# Patient Record
Sex: Female | Born: 1965 | Race: Black or African American | Hispanic: No | Marital: Single | Smoking: Never smoker
Health system: Southern US, Community
[De-identification: ages and names within clinical notes are randomized; demographics above are authoritative.]

## PROBLEM LIST (undated history)

## (undated) DIAGNOSIS — I10 Essential (primary) hypertension: Secondary | ICD-10-CM

## (undated) DIAGNOSIS — E119 Type 2 diabetes mellitus without complications: Secondary | ICD-10-CM

## (undated) HISTORY — PX: TUBAL LIGATION: SHX77

---
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
## 2020-03-28 ENCOUNTER — Observation Stay (HOSPITAL_COMMUNITY)
Admission: EM | Admit: 2020-03-28 | Discharge: 2020-03-29 | Disposition: A | Discharge status: Home / Self Care | Payer: PRIVATE HEALTH INSURANCE | Attending: Family Medicine | Admitting: Family Medicine

## 2020-03-28 ENCOUNTER — Emergency Department (HOSPITAL_COMMUNITY): Payer: PRIVATE HEALTH INSURANCE

## 2020-03-28 ENCOUNTER — Encounter (HOSPITAL_COMMUNITY): Payer: Self-pay | Admitting: Family Medicine

## 2020-03-28 ENCOUNTER — Other Ambulatory Visit: Payer: Self-pay

## 2020-03-28 DIAGNOSIS — Z91040 Latex allergy status: Secondary | ICD-10-CM | POA: Insufficient documentation

## 2020-03-28 DIAGNOSIS — Z20822 Contact with and (suspected) exposure to covid-19: Secondary | ICD-10-CM | POA: Insufficient documentation

## 2020-03-28 DIAGNOSIS — E119 Type 2 diabetes mellitus without complications: Secondary | ICD-10-CM | POA: Insufficient documentation

## 2020-03-28 DIAGNOSIS — E782 Mixed hyperlipidemia: Secondary | ICD-10-CM | POA: Insufficient documentation

## 2020-03-28 DIAGNOSIS — R072 Precordial pain: Secondary | ICD-10-CM | POA: Diagnosis not present

## 2020-03-28 DIAGNOSIS — Z7984 Long term (current) use of oral hypoglycemic drugs: Secondary | ICD-10-CM | POA: Insufficient documentation

## 2020-03-28 DIAGNOSIS — I152 Hypertension secondary to endocrine disorders: Secondary | ICD-10-CM | POA: Diagnosis present

## 2020-03-28 DIAGNOSIS — I10 Essential (primary) hypertension: Secondary | ICD-10-CM | POA: Insufficient documentation

## 2020-03-28 DIAGNOSIS — R0789 Other chest pain: Secondary | ICD-10-CM | POA: Diagnosis present

## 2020-03-28 DIAGNOSIS — E1169 Type 2 diabetes mellitus with other specified complication: Secondary | ICD-10-CM | POA: Insufficient documentation

## 2020-03-28 DIAGNOSIS — R079 Chest pain, unspecified: Secondary | ICD-10-CM | POA: Diagnosis present

## 2020-03-28 DIAGNOSIS — Z79899 Other long term (current) drug therapy: Secondary | ICD-10-CM | POA: Diagnosis not present

## 2020-03-28 DIAGNOSIS — E1159 Type 2 diabetes mellitus with other circulatory complications: Secondary | ICD-10-CM | POA: Diagnosis present

## 2020-03-28 HISTORY — DX: Essential (primary) hypertension: I10

## 2020-03-28 HISTORY — DX: Type 2 diabetes mellitus without complications: E11.9

## 2020-03-28 HISTORY — DX: Other chest pain: R07.89

## 2020-03-28 LAB — COMPREHENSIVE METABOLIC PANEL
ALT: 16 U/L (ref 0–44)
AST: 15 U/L (ref 15–41)
Albumin: 3.6 g/dL (ref 3.5–5.0)
Alkaline Phosphatase: 67 U/L (ref 38–126)
Anion gap: 9 (ref 5–15)
BUN: 9 mg/dL (ref 6–20)
CO2: 28 mmol/L (ref 22–32)
Calcium: 9.5 mg/dL (ref 8.9–10.3)
Chloride: 99 mmol/L (ref 98–111)
Creatinine, Ser: 0.82 mg/dL (ref 0.44–1.00)
GFR, Estimated: >60 mL/min (ref >60)
Glucose, Bld: 220 mg/dL — ABNORMAL HIGH (ref 70–99)
Potassium: 4.1 mmol/L (ref 3.5–5.1)
Sodium: 136 mmol/L (ref 135–145)
Total Bilirubin: 0.4 mg/dL (ref 0.3–1.2)
Total Protein: 7.6 g/dL (ref 6.5–8.1)

## 2020-03-28 LAB — CBC WITH DIFFERENTIAL/PLATELET
Abs Immature Granulocytes: 0.02 10*3/uL (ref 0.00–0.07)
Basophils Absolute: 0 10*3/uL (ref 0.0–0.1)
Basophils Relative: 1 %
Eosinophils Absolute: 0.2 10*3/uL (ref 0.0–0.5)
Eosinophils Relative: 3 %
HCT: 39.5 % (ref 36.0–46.0)
Hemoglobin: 12.2 g/dL (ref 12.0–15.0)
Immature Granulocytes: 0 %
Lymphocytes Relative: 30 %
Lymphs Abs: 2 10*3/uL (ref 0.7–4.0)
MCH: 25.8 pg — ABNORMAL LOW (ref 26.0–34.0)
MCHC: 30.9 g/dL (ref 30.0–36.0)
MCV: 83.7 fL (ref 80.0–100.0)
Monocytes Absolute: 0.6 10*3/uL (ref 0.1–1.0)
Monocytes Relative: 9 %
Neutro Abs: 3.9 10*3/uL (ref 1.7–7.7)
Neutrophils Relative %: 57 %
Platelets: 315 10*3/uL (ref 150–400)
RBC: 4.72 MIL/uL (ref 3.87–5.11)
RDW: 14.4 % (ref 11.5–15.5)
WBC: 6.7 10*3/uL (ref 4.0–10.5)
nRBC: 0 % (ref 0.0–0.2)

## 2020-03-28 LAB — RESPIRATORY PANEL BY RT PCR (FLU A&B, COVID)
Influenza A by PCR: NEGATIVE
Influenza B by PCR: NEGATIVE
SARS Coronavirus 2 by RT PCR: NEGATIVE

## 2020-03-28 LAB — TROPONIN I (HIGH SENSITIVITY)
Troponin I (High Sensitivity): 6 ng/L (ref <18)
Troponin I (High Sensitivity): 7 ng/L (ref <18)

## 2020-03-28 LAB — D-DIMER, QUANTITATIVE: D-Dimer, Quant: 0.35 ug/mL-FEU (ref 0.00–0.50)

## 2020-03-28 LAB — HIV ANTIBODY (ROUTINE TESTING W REFLEX): HIV Screen 4th Generation wRfx: NONREACTIVE

## 2020-03-28 LAB — MRSA PCR SCREENING: MRSA by PCR: NEGATIVE

## 2020-03-28 MED ORDER — ATORVASTATIN CALCIUM 40 MG PO TABS
40.0000 mg | ORAL_TABLET | Freq: Every day | ORAL | Status: DC
  Administered 2020-03-28: 40 mg via ORAL
  Filled 2020-03-28: qty 1

## 2020-03-28 MED ORDER — HEPARIN SODIUM (PORCINE) 5000 UNIT/ML IJ SOLN
5000.0000 [IU] | Freq: Three times a day (TID) | INTRAMUSCULAR | Status: DC
  Administered 2020-03-28 – 2020-03-29 (×2): 5000 [IU] via SUBCUTANEOUS
  Filled 2020-03-28 (×3): qty 1

## 2020-03-28 MED ORDER — ACETAMINOPHEN 325 MG PO TABS: 650.0000 mg | ORAL_TABLET | ORAL | Status: DC | PRN

## 2020-03-28 MED ORDER — INSULIN ASPART 100 UNIT/ML ~~LOC~~ SOLN
0.0000 [IU] | Freq: Three times a day (TID) | SUBCUTANEOUS | Status: DC
  Administered 2020-03-29: 2 [IU] via SUBCUTANEOUS
  Administered 2020-03-29: 1 [IU] via SUBCUTANEOUS

## 2020-03-28 MED ORDER — ONDANSETRON HCL 4 MG/2ML IJ SOLN: 4.0000 mg | Freq: Four times a day (QID) | INTRAMUSCULAR | Status: DC | PRN

## 2020-03-28 MED ORDER — NITROGLYCERIN 0.4 MG SL SUBL: 0.4000 mg | SUBLINGUAL_TABLET | SUBLINGUAL | Status: DC | PRN

## 2020-03-28 NOTE — ED Provider Notes (Signed)
[facility] EMERGENCY DEPARTMENT Provider Note   CSN: [id number] Arrival date & time: 03/28/20  1155     History Chief Complaint  Patient presents with  . Chest Pain    Valerie Morgan is a 54 y.o. female.  The history is provided by the patient and medical records.  Chest Pain  Valerie Morgan is a 54 y.o. female who presents to the Emergency Department complaining of chest pain. She presents the emergency department complaining of left sided chest pain that started when she was sitting up from doing her nails. Pain is described as a pressure sensation that radiates to her back. His worse with movement. It is also worse with deep breaths. She had nitroglycerin by EMS prior to ED arrival with significant improvement in her pain. She also received 324 of aspirin prior to ED arrival. She has a history of hypertension, diabetes, hyperlipidemia. She has a family history of coronary artery disease and her father at 19. She does not smoke. She uses occasional alcohol. No drug use.    Past Medical History:  Diagnosis Date  . Diabetes mellitus (HCC)   . Hypertension   . Meningitis spinal 1983    Patient Active Problem List   Diagnosis Date Noted  . Chest pain 03/28/2020    Past Surgical History:  Procedure Laterality Date  . TUBAL LIGATION       OB History   No obstetric history on file.     Family History  Problem Relation Age of Onset  . Congestive Heart Failure Father     Social History   Tobacco Use  . Smoking status: Never Smoker  Substance Use Topics  . Alcohol use: Not Currently  . Drug use: Never    Home Medications Prior to Admission medications   Medication Sig Start Date End Date Taking? Authorizing Provider  atorvastatin (LIPITOR) 40 MG tablet Take 40 mg by mouth at bedtime.   Yes [provider]  lisinopril (ZESTRIL) 5 MG tablet Take 5 mg by mouth daily.   Yes [provider]  metFORMIN (GLUCOPHAGE-XR) 750 MG 24 hr  tablet Take 750 mg by mouth daily with breakfast.   Yes [provider]    Allergies    Latex and Penicillins  Review of Systems   Review of Systems  Cardiovascular: Positive for chest pain.  All other systems reviewed and are negative.   Physical Exam Updated Vital Signs BP 128/83 (BP Location: Right Arm)   Pulse 86   Temp 98.4 F (36.9 C) (Oral)   Resp 16   Ht 5\' 6"  (1.676 m)   Wt 104.3 kg   SpO2 97%   BMI 37.11 kg/m   Physical Exam Vitals and nursing note reviewed.  Constitutional:      Appearance: She is well-developed.  HENT:     Head: Normocephalic and atraumatic.  Cardiovascular:     Rate and Rhythm: Normal rate and regular rhythm.     Heart sounds: No murmur heard.   Pulmonary:     Effort: Pulmonary effort is normal. No respiratory distress.     Breath sounds: Normal breath sounds.  Chest:     Chest wall: No tenderness.  Abdominal:     Palpations: Abdomen is soft.     Tenderness: There is no abdominal tenderness. There is no guarding or rebound.  Musculoskeletal:        General: No swelling or tenderness.     Comments: 2+ radial and DP pulses bilaterally  Skin:    General: Skin is warm and dry.  Neurological:     Mental Status: She is alert and oriented to person, place, and time.  Psychiatric:        Behavior: Behavior normal.     ED Results / Procedures / Treatments   Labs (all labs ordered are listed, but only abnormal results are displayed) Labs Reviewed  COMPREHENSIVE METABOLIC PANEL - Abnormal; Notable for the following components:      Result Value   Glucose, Bld 220 (*)    All other components within normal limits  CBC WITH DIFFERENTIAL/PLATELET - Abnormal; Notable for the following components:   MCH 25.8 (*)    All other components within normal limits  RESPIRATORY PANEL BY RT PCR (FLU A&B, COVID)  MRSA PCR SCREENING  D-DIMER, QUANTITATIVE (NOT AT [facility])  HIV ANTIBODY (ROUTINE TESTING W REFLEX)  HEMOGLOBIN A1C  CBC    BASIC METABOLIC PANEL  LIPID PANEL  TROPONIN I (HIGH SENSITIVITY)  TROPONIN I (HIGH SENSITIVITY)    EKG EKG Interpretation  Date/Time:  Saturday March 28 2020 12:05:08 EDT Ventricular Rate:  84 PR Interval:    QRS Duration: 101 QT Interval:  357 QTC Calculation: 422 R Axis:   -36 Text Interpretation: Sinus rhythm Left axis deviation Low voltage, precordial leads Abnormal R-wave progression, early transition Borderline T abnormalities, diffuse leads No previous tracing Confirmed by Tilden Fossa [phone]) on 03/28/2020 12:06:30 PM   Radiology DG Chest 2 View  Result Date: 03/28/2020 CLINICAL DATA:  Acute LEFT chest pain and shortness of breath today. EXAM: CHEST - 2 VIEW COMPARISON:  None. FINDINGS: The cardiomediastinal silhouette is unremarkable. Equivocal lingular opacity noted. Mild peribronchial thickening is noted of uncertain chronicity. There is no evidence of pulmonary edema, suspicious pulmonary nodule/mass, pleural effusion, or pneumothorax. No acute bony abnormalities are identified. IMPRESSION: Equivocal lingular opacity which may represent atelectasis or airspace disease/pneumonia. Mild peribronchial thickening of uncertain chronicity. Electronically Signed   By: Harmon Pier M.D.   On: 03/28/2020 12:50    Procedures Procedures (including critical care time)  Medications Ordered in ED Medications  atorvastatin (LIPITOR) tablet 40 mg (has no administration in time range)  acetaminophen (TYLENOL) tablet 650 mg (has no administration in time range)  ondansetron (ZOFRAN) injection 4 mg (has no administration in time range)  heparin injection 5,000 Units (has no administration in time range)  insulin aspart (novoLOG) injection 0-9 Units (has no administration in time range)    ED Course  I have reviewed the triage vital signs and the nursing notes.  Pertinent labs & imaging results that were available during my care of the patient were reviewed by me and considered  in my medical decision making (see chart for details).    MDM Rules/Calculators/A&P                         patient with history of diabetes, hypertension, hyperlipidemia here for evaluation of chest pain that started just prior to ED arrival. EKG with nonspecific changes and no priors available for comparison. Doubt PE, D dimer is negative. Chest x-ray with atelectasis, doubt pneumonia, personally reviewed. Patient's pain did improve with nitroglycerin and on repeat assessment is resolved. Given her hear score of five recommend observation for chest pain rule out. Discussed with patient findings of studies and she is in agreement with plan. Medicine consulted for admission.  Final Clinical Impression(s) / ED Diagnoses Final diagnoses:  Precordial pain    Rx /  DC Orders ED Discharge Orders    None       Tilden Fossa, MD 03/28/20 2152

## 2020-03-28 NOTE — ED Triage Notes (Signed)
Pt arrives via EMS from home with complaints of sudden onset of chest pressure radiating to back. Movement would increase pain. 7/10 Pt hypertensive at 190/100 with EMS. Did not take BP medicine today  EMS gave 1X 0.4 Nitro with moderate relief of pain 5/10 324 ASA

## 2020-03-28 NOTE — H&P (Signed)
Family Medicine Teaching [facility] Admission History and Physical Service Pager: [phone]  Patient name: Valerie Morgan Medical record number: [id number] Date of birth: [birth year] Age: 54 y.o. Gender: female  Primary Care Provider: Patient, No Pcp Per Consultants: none Code Status: full  Chief Complaint: chest pain  Assessment and Plan: Valerie Morgan is a 54 y.o. female presenting with 1 day hx of chest pain . PMH is significant for HTN, DM  Chest pain  One day hx of chest pain in the left side with similar pain on the left posterior chest..  Worse with positional changes and palpation.  Pain improved but still present from this morning.  Negative trops.  T waves flat.  D-dimer negative.  Normal CXR.  Given the association with positions and movement as well as the timing coinciding with stretching forward, this is likely to be musculoskeletal pain/chest wall pain.  Possibly GI related pain from reflux.  Doubtful this is ACS.  Will admit for overnight observation, get echo and morning EKG. -Admit to obs, Dr. Celso Amy attending -Follow-up echo -Morning EKG -Continuous cardiac monitoring -Vitals per routine -A.m. CBC and BMP -A.m. lipid panel  Diabetes mellitus Prior history of diabetes for which she takes Metformin. -Sensitive sliding scale insulin with CBGs -A.m. A1c  HTN Patient takes 5 mg of lisinopril she states.  Normotensive today -Hold home medication   FEN/GI: Heart healthy diet Prophylaxis: Lovenox  Disposition: Home  History of Present Illness:  Valerie Morgan is a 54 y.o. female presenting with 1 day history of chest pain  Chest pain started at 10am this morning.  She was 'pampering her feet' on the couch and developed pain while reaching for something.  She walked around and massaged it to improve the pain but then it started getting 'tense' so she called EMS. The EMS gave her aspirin and nitro and this helped her pain.   Pain is still there but decreased  to about a 4/10 and before was a 7/10. No nuasea or sweating.  She had SOB with pain that occurred with certain positions, mostly the left side.  Pain radiated towards her back, on the left side.  No hx of heart disease.  Father had  hx of MI, and CHF.    Has possible acid reflux that is mild but does not take medications for it, just avoids certain foods..   No fever/diarrhea/abdominal pain.     Just moved from [place] in June.  Lives alone.   No family down here, but does have a few friends.  Never smoked cigarettes.  Drinks rarely.  No drug use.    Takes Metformin 750 qd XR, 40mg  of atorvastatin, and 5mg  lisinopril. Compliant with all medications.   No other medical issues.  Had a history of spinal meningitis in 1983  Surgical history includes tubal ligation.    Allergic to penicillin.     Review Of Systems: Per HPI with the following additions:   Review of Systems  Constitutional: Negative for chills, fever and weight loss.  HENT: Negative for hearing loss and sore throat.   Eyes: Negative for blurred vision and double vision.  Respiratory: Positive for shortness of breath. Negative for cough.   Cardiovascular: Positive for chest pain. Negative for palpitations, orthopnea and leg swelling.  Gastrointestinal: Negative for abdominal pain, diarrhea, nausea and vomiting.  Genitourinary: Negative for dysuria.  Musculoskeletal: Negative for joint pain and myalgias.  Skin: Negative for rash.  Neurological: Positive for headaches. Negative for dizziness.  Endo/Heme/Allergies: Bruises/bleeds  easily.  Psychiatric/Behavioral: Negative for substance abuse.    Patient Active Problem List   Diagnosis Date Noted  . Chest pain 03/28/2020    Past Medical History: Past Medical History:  Diagnosis Date  . Diabetes mellitus (HCC)   . Hypertension   . Meningitis spinal 1983    Past Surgical History: Past Surgical History:  Procedure Laterality Date  . TUBAL LIGATION      Social  History: Social History   Tobacco Use  . Smoking status: Never Smoker  Substance Use Topics  . Alcohol use: Not Currently  . Drug use: Never   Additional social history:   Please also refer to relevant sections of EMR.  Family History: Family History  Problem Relation Age of Onset  . Congestive Heart Failure Father     Allergies and Medications: Allergies  Allergen Reactions  . Latex Rash  . Penicillins Rash    Reaction: 25 years ago   No current facility-administered medications on file prior to encounter.   Current Outpatient Medications on File Prior to Encounter  Medication Sig Dispense Refill  . atorvastatin (LIPITOR) 40 MG tablet Take 40 mg by mouth at bedtime.    Marland Kitchen lisinopril (ZESTRIL) 5 MG tablet Take 5 mg by mouth daily.    . metFORMIN (GLUCOPHAGE-XR) 750 MG 24 hr tablet Take 750 mg by mouth daily with breakfast.      Objective: BP 128/83 (BP Location: Right Arm)   Pulse 86   Temp 98.4 F (36.9 C) (Oral)   Resp 16   Ht 5\' 6"  (1.676 m)   Wt 104.3 kg   SpO2 97%   BMI 37.11 kg/m  Exam: General: Alert, oriented.  No acute distress.  Laying in bed Eyes: PERRLA.  EOMI. ENTM: Moist oral mucosa.  No oral pharyngeal erythema Neck: No thyromegaly Cardiovascular: Regular rate and rhythm, no no murmurs Respiratory: Lungs clear to auscultation bilaterally, no wheezes or crackles Gastrointestinal: Soft, nontender.  Normal bowel sounds MSK: 5/5 strength in the upper and lower extremities bilaterally.  Tender to palpation on the left chest wall and left sided mid thoracic back.  Pain in the chest with crossing of left arm over chest. Derm: No rashes.  Warm and dry Neuro: Cranial nerves II through XII grossly intact.  Sensation intact. Psych: Normal affect.  Labs and Imaging: CBC BMET  Recent Labs  Lab 03/28/20 1205  WBC 6.7  HGB 12.2  HCT 39.5  PLT 315   Recent Labs  Lab 03/28/20 1205  NA 136  K 4.1  CL 99  CO2 28  BUN 9  CREATININE 0.82  GLUCOSE  220*  CALCIUM 9.5       03/30/20, MD 03/28/2020, 8:11 PM PGY-3, [facility] Family Medicine FPTS Intern pager: [phone], text pages welcome

## 2020-03-29 ENCOUNTER — Encounter (HOSPITAL_COMMUNITY): Payer: Self-pay | Admitting: Family Medicine

## 2020-03-29 ENCOUNTER — Observation Stay (HOSPITAL_BASED_OUTPATIENT_CLINIC_OR_DEPARTMENT_OTHER): Payer: PRIVATE HEALTH INSURANCE

## 2020-03-29 DIAGNOSIS — E119 Type 2 diabetes mellitus without complications: Secondary | ICD-10-CM

## 2020-03-29 DIAGNOSIS — R0789 Other chest pain: Secondary | ICD-10-CM | POA: Diagnosis not present

## 2020-03-29 DIAGNOSIS — E782 Mixed hyperlipidemia: Secondary | ICD-10-CM

## 2020-03-29 DIAGNOSIS — R072 Precordial pain: Secondary | ICD-10-CM | POA: Diagnosis not present

## 2020-03-29 DIAGNOSIS — R079 Chest pain, unspecified: Secondary | ICD-10-CM

## 2020-03-29 DIAGNOSIS — E1159 Type 2 diabetes mellitus with other circulatory complications: Secondary | ICD-10-CM | POA: Diagnosis not present

## 2020-03-29 DIAGNOSIS — I152 Hypertension secondary to endocrine disorders: Secondary | ICD-10-CM

## 2020-03-29 DIAGNOSIS — E1169 Type 2 diabetes mellitus with other specified complication: Secondary | ICD-10-CM

## 2020-03-29 HISTORY — DX: Type 2 diabetes mellitus without complications: E11.9

## 2020-03-29 HISTORY — DX: Type 2 diabetes mellitus with other specified complication: E11.69

## 2020-03-29 HISTORY — DX: Hypertension secondary to endocrine disorders: I15.2

## 2020-03-29 HISTORY — DX: Type 2 diabetes mellitus with other circulatory complications: E11.59

## 2020-03-29 LAB — LIPID PANEL
Cholesterol: 307 mg/dL — ABNORMAL HIGH (ref 0–200)
HDL: 59 mg/dL (ref >40)
LDL Cholesterol: 200 mg/dL — ABNORMAL HIGH (ref 0–99)
Total CHOL/HDL Ratio: 5.2 RATIO
Triglycerides: 242 mg/dL — ABNORMAL HIGH (ref <150)
VLDL: 48 mg/dL — ABNORMAL HIGH (ref 0–40)

## 2020-03-29 LAB — CBC
HCT: 38.4 % (ref 36.0–46.0)
Hemoglobin: 12.2 g/dL (ref 12.0–15.0)
MCH: 26.5 pg (ref 26.0–34.0)
MCHC: 31.8 g/dL (ref 30.0–36.0)
MCV: 83.3 fL (ref 80.0–100.0)
Platelets: 312 10*3/uL (ref 150–400)
RBC: 4.61 MIL/uL (ref 3.87–5.11)
RDW: 14.6 % (ref 11.5–15.5)
WBC: 7.7 10*3/uL (ref 4.0–10.5)
nRBC: 0 % (ref 0.0–0.2)

## 2020-03-29 LAB — BASIC METABOLIC PANEL
Anion gap: 11 (ref 5–15)
BUN: 9 mg/dL (ref 6–20)
CO2: 25 mmol/L (ref 22–32)
Calcium: 9.2 mg/dL (ref 8.9–10.3)
Chloride: 102 mmol/L (ref 98–111)
Creatinine, Ser: 0.69 mg/dL (ref 0.44–1.00)
GFR, Estimated: >60 mL/min (ref >60)
Glucose, Bld: 149 mg/dL — ABNORMAL HIGH (ref 70–99)
Potassium: 3.9 mmol/L (ref 3.5–5.1)
Sodium: 138 mmol/L (ref 135–145)

## 2020-03-29 LAB — GLUCOSE, CAPILLARY
Glucose-Capillary: 169 mg/dL — ABNORMAL HIGH (ref 70–99)
Glucose-Capillary: 132 mg/dL — ABNORMAL HIGH (ref 70–99)

## 2020-03-29 LAB — ECHOCARDIOGRAM COMPLETE
Area-P 1/2: 4.68 cm2
Height: 66 in
S' Lateral: 2.9 cm
Weight: 3678.4 oz

## 2020-03-29 MED ORDER — FAMOTIDINE 20 MG PO TABS: 20.0000 mg | ORAL_TABLET | Freq: Two times a day (BID) | ORAL | 0 refills | Status: DC

## 2020-03-29 NOTE — Hospital Course (Addendum)
Valerie Morgan is a 54 y.o. female presenting with 1 day hx of chest pain . PMH is significant for HTN and DM.  Chest pain Presented with chest pain that radiated down her left arm after bending down to clip her toe nails. Pain significantly improved after given aspirin and nitroglycerin in the ED. Troponins 6>7. D-dimer negative. CXR unremarkable. EKG demonstrated no significant ST elevations with normal sinus rhythm compared to initial EKG noted to have flat T waves. Patient placed on continued cardiac monitoring. Echo demonstrated *** Given patient's pain due to positional changes and reproducible on exam along with GERD component as she reports eating 30 minutes prior to this episode, likely MSK etiology with a GI component. Very low concern for ACS. Pain improved and patient discharged.   Hyperlipidemia Lipid panel demonstrated elevated cholesterol 307, LDL 200 and triglycerides 657. Started on atorvastatin 40 mg daily. Follow up with PCP for repeat lipid panel along with diet and exercise counseling.   All other issues chronic and stable.   F/u: establish care with PCP, f/u lipid panel and Ac1 in 3 months. Diet and exercise counseling, f/u cards outpatient if chest pain returns

## 2020-03-29 NOTE — Discharge Instructions (Signed)
You were hospitalized at [facility] due to chest pain.  We expect this is from a combination of muscle strain and reflux which improved after aspirin and nitroglycerin medications in the ED. We are so glad you are feeling better.  Please also be sure to follow-up with our clinic/PCP, your next appointment is 04/09/2020 at 3:15 pm.  Thank you for allowing [place] to take care of you.  - Stop by your pharmacy to pick up your new prescriptions   Take care, Dr. Robyne Peers

## 2020-03-29 NOTE — Progress Notes (Signed)
  Echocardiogram 2D Echocardiogram has been performed.  Delcie Roch 03/29/2020, 11:21 AM

## 2020-03-29 NOTE — Progress Notes (Addendum)
Pt got discharged to home, discharge instructions provided and patient showed understanding to it, IV taken out,Telemonitor DC,pt left unit in wheelchair with all of the belongings.Taxi cab is called for the patient with the help of social worker, Conservation officer, nature  provided to the patient  Lonia Farber, Charity fundraiser

## 2020-03-29 NOTE — Discharge Summary (Addendum)
Family Medicine Teaching [facility] Discharge Summary  Patient name: Valerie Morgan Medical record number: [id number] Date of birth: [birth year] Age: 54 y.o. Gender: female Date of Admission: 03/28/2020  Date of Discharge: 03/29/2020 Admitting Physician: Sandre Kitty, MD  Primary Care Provider: Patient, No Pcp Per Consultants: none  Indication for Hospitalization: chest pain   Discharge Diagnoses/Problem List:  Chest pain HLD HTN DM  Disposition: home  Discharge Condition: medically stable   Discharge Exam:   Physical Exam: General: Patient sitting upright in bed comfortably, in no acute distress. Cardiovascular: RRR, no murmurs or gallops noted  Respiratory: lungs clear to auscultation bilaterally, breathing comfortably on room air  Abdomen: soft, nontender, presence of active bowel sounds  Extremities: radial and dorsalis pedis pulses strong and equal bilaterally, no LE edema noted   Brief Hospital Course:  Valerie Morgan is a 54 y.o. female presenting with 1 day hx of chest pain . PMH is significant for HTN and DM.  Chest pain Presented with chest pain that radiated down her left arm after bending down to clip her toe nails. Pain significantly improved after given aspirin and nitroglycerin in the ED. Troponins 6>7. D-dimer negative. CXR unremarkable. EKG demonstrated no significant ST elevations with normal sinus rhythm compared to initial EKG noted to have flat T waves. Patient placed on continued cardiac monitoring. Echo demonstrated EF 60-65% without any regional wall motion abnormalities. Given patient's pain due to positional changes and reproducible on exam along with GERD component as she reports eating 30 minutes prior to this episode, likely MSK etiology with a GI component. Very low concern for ACS. Pain improved and patient discharged.   Hyperlipidemia Lipid panel demonstrated elevated cholesterol 307, LDL 200 and triglycerides 528. Started on atorvastatin 40  mg daily. Follow up with PCP for repeat lipid panel along with diet and exercise counseling.  HTN Patient home med includes lisinopril 5 mg. Multiple hypotensive blood pressures during hospital stay. Lisinopril held at discharge, instructed to follow up with PCP regarding potentially restarting medication. Diet and exercise discussed with patient.   All other issues chronic and stable.     Issues for Follow Up:  1. Follow up with PCP for lipid panel and A1c. Diet and exercise counseling. 2. Follow up with PCP regarding lisinopril medication held due to low blood pressures during hospitalization.  3. Consider outpatient cardiology follow up if chest pain returns.  Significant Procedures:  none  Significant Labs and Imaging:  Recent Labs  Lab 03/28/20 1205 03/29/20 0059  WBC 6.7 7.7  HGB 12.2 12.2  HCT 39.5 38.4  PLT 315 312   Recent Labs  Lab 03/28/20 1205 03/29/20 0059  NA 136 138  K 4.1 3.9  CL 99 102  CO2 28 25  GLUCOSE 220* 149*  BUN 9 9  CREATININE 0.82 0.69  CALCIUM 9.5 9.2  ALKPHOS 67  --   AST 15  --   ALT 16  --   ALBUMIN 3.6  --       Results/Tests Pending at Time of Discharge:  ECHOCARDIOGRAM COMPLETE  Result Date: 03/29/2020    ECHOCARDIOGRAM REPORT   Patient Name:   Valerie Morgan Date of Exam: 03/29/2020 Medical Rec #:  [id number]      Height:       66.0 in Accession #:    [id number]     Weight:       229.9 lb Date of Birth:  1965/11/04      BSA:  2.122 m Patient Age:    54 years       BP:           116/58 mmHg Patient Gender: F              HR:           75 bpm. Exam Location:  Inpatient Procedure: 2D Echo Indications:    chest pain 786.50  History:        Patient has no prior history of Echocardiogram examinations.                 Signs/Symptoms:Chest Pain; Risk Factors:Diabetes, Dyslipidemia                 and Hypertension.  Sonographer:    Delcie Roch Referring Phys: [id number] TODD D MCDIARMID IMPRESSIONS  1. Left ventricular ejection  fraction, by estimation, is 60 to 65%. The left ventricle has normal function. The left ventricle has no regional wall motion abnormalities. Left ventricular diastolic parameters were normal.  2. Right ventricular systolic function is normal. The right ventricular size is normal. There is normal pulmonary artery systolic pressure.  3. The mitral valve is normal in structure. Trivial mitral valve regurgitation. No evidence of mitral stenosis.  4. The aortic valve is normal in structure. Aortic valve regurgitation is not visualized. No aortic stenosis is present.  5. The inferior vena cava is normal in size with greater than 50% respiratory variability, suggesting right atrial pressure of 3 mmHg. Comparison(s): No prior Echocardiogram. Conclusion(s)/Recommendation(s): Normal biventricular function without evidence of hemodynamically significant valvular heart disease. FINDINGS  Left Ventricle: Left ventricular ejection fraction, by estimation, is 60 to 65%. The left ventricle has normal function. The left ventricle has no regional wall motion abnormalities. The left ventricular internal cavity size was normal in size. There is  no left ventricular hypertrophy. Left ventricular diastolic parameters were normal. Right Ventricle: The right ventricular size is normal. No increase in right ventricular wall thickness. Right ventricular systolic function is normal. There is normal pulmonary artery systolic pressure. The tricuspid regurgitant velocity is 2.33 m/s, and  with an assumed right atrial pressure of 3 mmHg, the estimated right ventricular systolic pressure is 24.7 mmHg. Left Atrium: Left atrial size was normal in size. Right Atrium: Right atrial size was normal in size. Pericardium: There is no evidence of pericardial effusion. Mitral Valve: The mitral valve is normal in structure. Trivial mitral valve regurgitation. No evidence of mitral valve stenosis. Tricuspid Valve: The tricuspid valve is normal in structure.  Tricuspid valve regurgitation is trivial. Aortic Valve: The aortic valve is normal in structure. Aortic valve regurgitation is not visualized. No aortic stenosis is present. Pulmonic Valve: The pulmonic valve was not well visualized. Pulmonic valve regurgitation is not visualized. Aorta: The aortic root, ascending aorta, aortic arch and descending aorta are all structurally normal, with no evidence of dilitation or obstruction. Venous: The inferior vena cava is normal in size with greater than 50% respiratory variability, suggesting right atrial pressure of 3 mmHg. IAS/Shunts: No atrial level shunt detected by color flow Doppler.  LEFT VENTRICLE PLAX 2D LVIDd:         4.40 cm  Diastology LVIDs:         2.90 cm  LV e' medial:    8.92 cm/s LV PW:         0.90 cm  LV E/e' medial:  8.6 LV IVS:        0.90 cm  LV e' lateral:  9.79 cm/s LVOT diam:     2.00 cm  LV E/e' lateral: 7.8 LV SV:         58 LV SV Index:   27 LVOT Area:     3.14 cm  RIGHT VENTRICLE             IVC RV S prime:     11.90 cm/s  IVC diam: 1.40 cm TAPSE (M-mode): 2.4 cm LEFT ATRIUM             Index       RIGHT ATRIUM           Index LA diam:        3.20 cm 1.51 cm/m  RA Area:     12.20 cm LA Vol (A2C):   49.8 ml 23.47 ml/m RA Volume:   26.70 ml  12.58 ml/m LA Vol (A4C):   43.6 ml 20.55 ml/m LA Biplane Vol: 47.1 ml 22.20 ml/m  AORTIC VALVE LVOT Vmax:   93.30 cm/s LVOT Vmean:  64.200 cm/s LVOT VTI:    0.185 m  AORTA Ao Root diam: 2.70 cm Ao Asc diam:  2.40 cm MITRAL VALVE               TRICUSPID VALVE MV Area (PHT): 4.68 cm    TR Peak grad:   21.7 mmHg MV Decel Time: 162 msec    TR Vmax:        233.00 cm/s MV E velocity: 76.30 cm/s MV A velocity: 81.80 cm/s  SHUNTS MV E/A ratio:  0.93        Systemic VTI:  0.18 m                            Systemic Diam: 2.00 cm Jodelle Red MD Electronically signed by Jodelle Red MD Signature Date/Time: 03/29/2020/12:30:02 PM    Final     Discharge Medications:  Allergies as of 03/29/2020       Reactions   Latex Rash   Penicillins Rash   Reaction: 25 years ago      Medication List    STOP taking these medications   lisinopril 5 MG tablet Commonly known as: ZESTRIL     TAKE these medications   atorvastatin 40 MG tablet Commonly known as: LIPITOR Take 40 mg by mouth at bedtime.   famotidine 20 MG tablet Commonly known as: PEPCID Take 1 tablet (20 mg total) by mouth 2 (two) times daily.   metFORMIN 750 MG 24 hr tablet Commonly known as: GLUCOPHAGE-XR Take 750 mg by mouth daily with breakfast.       Discharge Instructions: Please refer to Patient Instructions section of EMR for full details.  Patient was counseled important signs and symptoms that should prompt return to medical care, changes in medications, dietary instructions, activity restrictions, and follow up appointments.   Follow-Up Appointments:  Follow-up Information    Ganta, Anupa, DO. Go on 04/09/2020.   Specialty: Family Medicine Why: Please arrive to your scheduled appointment at 3:15 pm.  Contact information: [address] [place] [address] [phone]               Reece Leader, DO 03/29/2020, 1:29 PM PGY-1, [facility]  Katha Cabal, DO PGY-2, [facility] Family Medicine 03/30/2020 2:37 PM

## 2020-03-29 NOTE — Plan of Care (Signed)

## 2020-03-29 NOTE — Progress Notes (Signed)
Family Medicine Teaching Service Daily Progress Note Intern Pager: [phone]  Patient name: Valerie Morgan Medical record number: [id number] Date of birth: [birth year] Age: 54 y.o. Gender: female  Primary Care Provider: Patient, No Pcp Per Consultants: none Code Status: Full   Pt Overview and Major Events to Date:  Admitted 10/23  Assessment and Plan: Valerie Morgan is a 54 y.o. female presenting with 1 day hx of chest pain . PMH is significant for HTN and DM.   Chest pain  Admitted for chest pain radiating to her left side. Improved with aspirin and nitroglycerin in the ED. Negative troponins and negative D-dimer.  Normal CXR. Today rates 2/10 pain, significantly improved compared to 6/10 on admission. EKG demonstrated no significant ST elevations with normal sinus rhythm compared to initial EKG noted to have flat T waves. Low concern for ACS at this time given prior reproducible pain on admission. Most likely MSK etiology given positional changes causing pain with  GERD component since patient reports that she ate about 30 minutes prior to this.   -echo complete, pending results -tylenol 650 mg prn for pain  -Continuous cardiac monitoring -awaiting CBC and BMP  Hyperlipidemia Lipid panel demonstrated elevated cholesterol 307, LDL 200 and triglycerides 329. -atorvastatin 40 mg daily -establish care with PCP and f/u outpatient   Diabetes mellitus Prior history of diabetes for which she takes Metformin. -hold home metformin -pending A1c -sSSI -monitor CBGs -f/u with PCP outpatient   HTN Normotensive this morning at 120/60. Home med includes lisinopril 5 mg.   -Hold home medication  FEN/GI: heart healthy diet  PPx: heparin    Disposition: inpatient, home following workup      Subjective:  Patient endorses significantly improved chest pain and denies dyspnea. Denies dizziness, headache and weakness. Reports that she has been up to use the restroom multiple times without  issues.  Objective: Temp:  [97.8 F (36.6 C)-98.4 F (36.9 C)] 98 F (36.7 C) (10/24 0449) Pulse Rate:  [74-92] 74 (10/24 0000) Resp:  [13-19] 16 (10/24 0000) BP: (108-145)/(45-83) 120/60 (10/24 0449) SpO2:  [91 %-100 %] 94 % (10/24 0000) Weight:  [101.6 kg-104.3 kg] 104.3 kg (10/23 1853) Physical Exam: General: Patient sitting upright in bed comfortably, in no acute distress. Cardiovascular: RRR, no murmurs or gallops noted  Respiratory: lungs clear to auscultation bilaterally, breathing comfortably on room air  Abdomen: soft, nontender, presence of active bowel sounds  Extremities: radial and dorsalis pedis pulses strong and equal bilaterally, no LE edema noted   Laboratory: Recent Labs  Lab 03/28/20 1205 03/29/20 0059  WBC 6.7 7.7  HGB 12.2 12.2  HCT 39.5 38.4  PLT 315 312   Recent Labs  Lab 03/28/20 1205 03/29/20 0059  NA 136 138  K 4.1 3.9  CL 99 102  CO2 28 25  BUN 9 9  CREATININE 0.82 0.69  CALCIUM 9.5 9.2  PROT 7.6  --   BILITOT 0.4  --   ALKPHOS 67  --   ALT 16  --   AST 15  --   GLUCOSE 220* 149*      Imaging/Diagnostic Tests: DG Chest 2 View  Result Date: 03/28/2020 CLINICAL DATA:  Acute LEFT chest pain and shortness of breath today. EXAM: CHEST - 2 VIEW COMPARISON:  None. FINDINGS: The cardiomediastinal silhouette is unremarkable. Equivocal lingular opacity noted. Mild peribronchial thickening is noted of uncertain chronicity. There is no evidence of pulmonary edema, suspicious pulmonary nodule/mass, pleural effusion, or pneumothorax. No acute bony abnormalities are identified.  IMPRESSION: Equivocal lingular opacity which may represent atelectasis or airspace disease/pneumonia. Mild peribronchial thickening of uncertain chronicity. Electronically Signed   By: Harmon Pier M.D.   On: 03/28/2020 12:50    Reece Leader, DO 03/29/2020, 7:15 AM PGY-1, [place] [facility] FPTS Intern pager: [phone], text pages welcome

## 2020-03-29 NOTE — Plan of Care (Signed)

## 2020-03-30 LAB — HEMOGLOBIN A1C
Hgb A1c MFr Bld: 8 % — ABNORMAL HIGH (ref 4.8–5.6)
Mean Plasma Glucose: 183 mg/dL

## 2020-04-09 ENCOUNTER — Ambulatory Visit (INDEPENDENT_AMBULATORY_CARE_PROVIDER_SITE_OTHER): Payer: PRIVATE HEALTH INSURANCE | Admitting: Family Medicine

## 2020-04-09 ENCOUNTER — Other Ambulatory Visit: Payer: Self-pay

## 2020-04-09 VITALS — BP 120/76 | HR 74 | Ht 66.0 in | Wt 232.5 lb

## 2020-04-09 DIAGNOSIS — Z23 Encounter for immunization: Secondary | ICD-10-CM

## 2020-04-09 DIAGNOSIS — E1159 Type 2 diabetes mellitus with other circulatory complications: Secondary | ICD-10-CM | POA: Diagnosis not present

## 2020-04-09 DIAGNOSIS — I152 Hypertension secondary to endocrine disorders: Secondary | ICD-10-CM

## 2020-04-09 DIAGNOSIS — Z0000 Encounter for general adult medical examination without abnormal findings: Secondary | ICD-10-CM | POA: Diagnosis not present

## 2020-04-09 DIAGNOSIS — K219 Gastro-esophageal reflux disease without esophagitis: Secondary | ICD-10-CM | POA: Diagnosis not present

## 2020-04-09 DIAGNOSIS — E782 Mixed hyperlipidemia: Secondary | ICD-10-CM

## 2020-04-09 NOTE — Progress Notes (Signed)
SUBJECTIVE:   CHIEF COMPLAINT / HPI:   Hospital follow up and establish care Patient presents for hospital follow up after being recently admitted for chest pain and dyspnea. 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 States that she recently ate before this and was experiencing reflux symptoms and epigastric pain. Cause of chest pain determined to be non-cardiac and possibly multifactorial from both MSK-related and GERD. Patient also here to establish care, she has been looking for a PCP since she moved from [place]. Denies chest pain and dyspnea today. Never had a colonoscopy or mammogram.   PERTINENT  PMH / PSH:   GERD Denies epigastric pain but states that her reflux symptoms have been worsening over the past week. Compliant on famotidine 20 mg bid. Tolerating medication well without complications. Endorses sometimes eating a late dinner and spicy food. Endorses globulus sensation but states that her medication really helps better control her symptoms.  HTN Prior to patient's hospitalization, she was taking lisinopril 5 mg daily, medication was not continued at discharge given normotensive pressures and history of dizziness along with the onset of her chest pain that initially got her admitted into the hospital. Denies chest pain and dyspnea. States that since she has stopped the lisinopril she has not been feeling dizzy, able to sleep better and not having headaches as often as she did while on the medication.  Type 2 DM A1c 8 on 10/24 (during last hospitalization). Compliant on home med metformin 750 mg daily, tolerating well without complications. Denies hypoglycemic episodes. Checks her glucose levels daily, they typically range between 110-120. Denies dizziness and vision changes. Has not seen an ophthalmologist in 2 years.   HLD Taking atorvastatin 40 mg daily. Most recent lipid panel notable for cholesterol 307, triglycerides 242 and LDL  200. Reports starting to exercise more often and aims to continue to do this more regularly and more often during the week.    OBJECTIVE:   BP 120/76   Pulse 74   Ht 5\' 6"  (1.676 m)   Wt 232 lb 8 oz (105.5 kg)   LMP 12/04/2017   SpO2 99%   BMI 37.53 kg/m   General: Patient well-appearing, in no acute distress. HEENT: normocephalic, atraumatic, supple neck without evidence of lymphadenopathy CV: RRR, no murmurs or gallops auscultated Resp: lungs clear to auscultation bilaterally, no rales or rhonchi noted Abdomen:soft, nontender, presence of active bowel sounds Derm: skin warm and dry to touch, no rashes or lesions noted Ext: radial and distal pulses strong and equal bilaterally, no LE edema noted bilaterally Neuro: ambulates without limitation or assistance, normal gait  Psych: mood appropriate, very pleasant    ASSESSMENT/PLAN:   GERD (gastroesophageal reflux disease) -continue famotidine 20 mg bid -instructed on GERD precautions: avoiding spicy foods and trigger foods, not eating right before bed, etc.   Hypertension associated with diabetes (HCC) -normotensive BP in the office of 120/76, within goal of 130/80 -did not restart lisinopril to prevent hypotension and dizziness associated with it -reassess and add antihypertensive agent as needed -continue metformin 750 mg -recheck A1c at next visit  -extensive diet and exercise counseling provided  -instructed to schedule ophthalmology appointment very soon, patient understands the high importance and agrees  HLD (hyperlipidemia) -continue atorvastatin 40 mg daily -lifestyle modifications encouraged and emphasized    Health maintenance  -received pneumococcal vaccine today -GI referral placed for colonoscopy  -ordered mammogram -received COVID vaccine prior to visit, will bring  records at next appointment     Reece Leader, DO [facility]

## 2020-04-09 NOTE — Patient Instructions (Addendum)
It was so great seeing you again! I am glad you are out of the hospital and feeling better!  Please schedule your ophthalmology appointment. I have also ordered a mammogram and colonoscopy, they should be in contact with you soon!  Please try to incorporate more vegetables into your diet and exercise at least 3 times a week. Please follow up in 3 months to recheck A1c. Please try to avoid sodas and spicy foods which can cause reflux symptoms. Try to eat at least 3 hours before bedtime as well.   Please follow up in 3 months! Thank you for allowing me to be a part of your medical care!

## 2020-04-09 NOTE — Progress Notes (Signed)
X °

## 2020-04-10 ENCOUNTER — Other Ambulatory Visit: Payer: Self-pay | Admitting: Family Medicine

## 2020-04-10 DIAGNOSIS — Z0000 Encounter for general adult medical examination without abnormal findings: Secondary | ICD-10-CM

## 2020-04-10 LAB — MICROALBUMIN / CREATININE URINE RATIO
Creatinine, Urine: 60.4 mg/dL
Microalb/Creat Ratio: 25 mg/g creat (ref 0–29)
Microalbumin, Urine: 15.2 ug/mL

## 2020-04-11 ENCOUNTER — Encounter: Payer: Self-pay | Admitting: Family Medicine

## 2020-04-11 DIAGNOSIS — E785 Hyperlipidemia, unspecified: Secondary | ICD-10-CM | POA: Insufficient documentation

## 2020-04-11 DIAGNOSIS — K219 Gastro-esophageal reflux disease without esophagitis: Secondary | ICD-10-CM | POA: Insufficient documentation

## 2020-04-11 NOTE — Assessment & Plan Note (Signed)
-  continue famotidine 20 mg bid -instructed on GERD precautions: avoiding spicy foods and trigger foods, not eating right before bed, etc.

## 2020-04-11 NOTE — Assessment & Plan Note (Addendum)
-  normotensive BP in the office of 120/76, within goal of 130/80 -did not restart lisinopril to prevent hypotension and dizziness associated with it -reassess and add antihypertensive agent as needed -continue metformin 750 mg -recheck A1c at next visit  -extensive diet and exercise counseling provided  -instructed to schedule ophthalmology appointment very soon, patient understands the high importance and agrees

## 2020-04-11 NOTE — Assessment & Plan Note (Signed)
-  continue atorvastatin 40 mg daily -lifestyle modifications encouraged and emphasized

## 2020-04-15 ENCOUNTER — Telehealth: Payer: Self-pay | Admitting: *Deleted

## 2020-04-15 NOTE — Telephone Encounter (Signed)
Is this a new thing? Just saw patient last week and she did not discuss this at the visit. But she had not had a recent mammogram so I ordered a screening for her last week. I think the only way to change it from screening to diagnostic is to call but I am on night shift this week. Please let me know if this needs to be changed. Thanks!

## 2020-04-15 NOTE — Telephone Encounter (Signed)
Patient called stating she needs an order placed for the diagnostic mammogram due to having clear liquid coming from her nipples.  She was asked by [facility] to update you.  Valerie Morgan,CMA

## 2020-04-16 ENCOUNTER — Other Ambulatory Visit: Payer: Self-pay | Admitting: Family Medicine

## 2020-04-16 DIAGNOSIS — Z0000 Encounter for general adult medical examination without abnormal findings: Secondary | ICD-10-CM

## 2020-04-16 NOTE — Telephone Encounter (Signed)
Ordered a diagnostic, thank you!

## 2020-04-16 NOTE — Telephone Encounter (Signed)
When the patient's schedule their screening mammogram, they are asked questions about symptoms, so she told them about the discharge.  You can just order a new diagnostic mammogram and they will cancel the screening one.  Valerie Morgan,CMA

## 2020-05-12 ENCOUNTER — Encounter: Payer: Self-pay | Admitting: Gastroenterology

## 2020-05-12 ENCOUNTER — Inpatient Hospital Stay: Admission: RE | Admit: 2020-05-12 | Payer: PRIVATE HEALTH INSURANCE | Source: Ambulatory Visit

## 2020-05-28 ENCOUNTER — Ambulatory Visit: Payer: PRIVATE HEALTH INSURANCE | Admitting: Physician Assistant

## 2020-06-15 ENCOUNTER — Encounter: Payer: Self-pay | Admitting: Gastroenterology

## 2020-06-15 ENCOUNTER — Ambulatory Visit (INDEPENDENT_AMBULATORY_CARE_PROVIDER_SITE_OTHER): Payer: 59 | Admitting: Gastroenterology

## 2020-06-15 VITALS — BP 116/82 | HR 80 | Ht 66.0 in | Wt 232.5 lb

## 2020-06-15 DIAGNOSIS — K219 Gastro-esophageal reflux disease without esophagitis: Secondary | ICD-10-CM

## 2020-06-15 DIAGNOSIS — Z1211 Encounter for screening for malignant neoplasm of colon: Secondary | ICD-10-CM

## 2020-06-15 MED ORDER — PLENVU 140 G PO SOLR: 1.0000 | ORAL | 0 refills | Status: AC

## 2020-06-15 NOTE — Progress Notes (Signed)
06/15/2020 Quincy Prisco [id number] 1966-01-20   HISTORY OF PRESENT ILLNESS: This is a pleasant 55 year old female who is new to our office.  She was referred here by Dr. Leveda Anna in order to discuss screening colonoscopy.  She has never had a colonoscopy in the past.  She denies any rectal bleeding or any issues with moving her bowels.  While she is here she also reports having issues with acid reflux particularly at nighttime after eating certain foods.  She says that after she eats spaghetti and meatballs at night she will wake up with burning sensation in her chest and throat.  She uses Tums on occasion, but for the most part has just been avoiding foods that have caused her issues.  She is not on any other acid reflux type medications.  This has all been going on intermittently for the past 2 years or so.  She admits to some larger pills getting stuck intermittently when she swallows them.  She denies any foods getting hung up and no dysphagia to liquids.   Past Medical History:  Diagnosis Date  . Chest pain, atypical 03/28/2020  . Diabetes mellitus (HCC)   . Diabetes mellitus, type II (HCC) 03/29/2020  . Hypertension   . Hypertension associated with diabetes (HCC) 03/29/2020  . Meningitis spinal 1983  . Mixed hyperlipidemia due to type 2 diabetes mellitus (HCC) 03/29/2020   Past Surgical History:  Procedure Laterality Date  . TUBAL LIGATION      reports that she has never smoked. She has never used smokeless tobacco. She reports previous alcohol use. She reports that she does not use drugs. family history includes Congestive Heart Failure in her father; Diabetes in her father and mother; Heart Problems in her father. Allergies  Allergen Reactions  . Latex Rash  . Penicillins Rash    Reaction: 25 years ago      Outpatient Encounter Medications as of 06/15/2020  Medication Sig  . atorvastatin (LIPITOR) 40 MG tablet Take 40 mg by mouth at bedtime.  . metFORMIN  (GLUCOPHAGE-XR) 750 MG 24 hr tablet Take 750 mg by mouth daily with breakfast.  . [DISCONTINUED] famotidine (PEPCID) 20 MG tablet Take 1 tablet (20 mg total) by mouth 2 (two) times daily. (Patient not taking: Reported on 06/15/2020)   No facility-administered encounter medications on file as of 06/15/2020.     REVIEW OF SYSTEMS  : All other systems reviewed and negative except where noted in the History of Present Illness.   PHYSICAL EXAM: BP 116/82 (BP Location: Left Arm, Patient Position: Sitting, Cuff Size: Large)   Pulse 80   Ht 5\' 6"  (1.676 m)   Wt 232 lb 8 oz (105.5 kg)   LMP 12/04/2017   BMI 37.53 kg/m  General: Well developed AA female in no acute distress Head: Normocephalic and atraumatic Eyes:  Sclerae anicteric, conjunctiva pink. Ears: Normal auditory acuity Lungs: Clear throughout to auscultation; no W/R/R. Heart: Regular rate and rhythm; no M/R/G. Abdomen: Soft, non-distended.  BS present.  Non-tender. Rectal:  Will be done at the time of colonoscopy. Musculoskeletal: Symmetrical with no gross deformities  Skin: No lesions on visible extremities Extremities: No edema  Neurological: Alert oriented x 4, grossly non-focal Psychological:  Alert and cooperative. Normal mood and affect  ASSESSMENT AND PLAN: *GERD: Usually occurring at night after she has eaten certain foods such as spaghetti, etc.  She has been avoiding those recently.  Is not on any type of acid reflux medications.  Also describes  intermittent issues with pills getting stuck.  No issues with food getting hung up or dysphagia to liquids.  We will plan for EGD with Dr. Adela Lank.  I am going to hold off on putting her on any daily acid reflux medications for now until we see results of EGD.  She will continue dietary modifications for now and can use Pepcid as needed. *CRC screening: Never had a colonoscopy in the past.  We will schedule Dr. Adela Lank as well.  **The risks, benefits, and alternatives to EGD  and colonoscopy were discussed with the patient and she consents to proceed.   CC:  Reece Leader, DO  CC:  Dr. Leveda Anna

## 2020-06-15 NOTE — Patient Instructions (Signed)
If you are age 57 or older, your body mass index should be between 23-30. Your Body mass index is 37.53 kg/m. If this is out of the aforementioned range listed, please consider follow up with your Primary Care Provider.  If you are age 60 or younger, your body mass index should be between 19-25. Your Body mass index is 37.53 kg/m. If this is out of the aformentioned range listed, please consider follow up with your Primary Care Provider.   You have been scheduled for a colonoscopy. Please follow written instructions given to you at your visit today.  Please pick up your prep supplies at the pharmacy within the next 1-3 days. If you use inhalers (even only as needed), please bring them with you on the day of your procedure.  _  _   ORAL DIABETIC MEDICATION INSTRUCTIONS  The day before your procedure:  Take your diabetic pill as you do normally  The day of your procedure:  Do not take your diabetic pill   We will check your blood sugar levels during the admission process and again in Recovery before discharging you home  Follow up pending the results of your Colonoscopy and Endoscopy.  Thank you for entrusting me with your care and choosing [facility].  Doug Sou, PA-C

## 2020-06-17 NOTE — Progress Notes (Signed)
Agree with assessment and plan as outlined. If she wants to try PPI in the interim while awaiting her procedures that is okay with me.

## 2020-06-29 ENCOUNTER — Other Ambulatory Visit: Payer: Self-pay | Admitting: Family Medicine

## 2020-06-29 DIAGNOSIS — Z1231 Encounter for screening mammogram for malignant neoplasm of breast: Secondary | ICD-10-CM

## 2020-07-02 ENCOUNTER — Other Ambulatory Visit: Payer: Self-pay | Admitting: Family Medicine

## 2020-07-02 ENCOUNTER — Other Ambulatory Visit: Payer: Self-pay

## 2020-07-02 ENCOUNTER — Ambulatory Visit
Admission: RE | Admit: 2020-07-02 | Discharge: 2020-07-02 | Disposition: A | Discharge status: Home / Self Care | Payer: 59 | Source: Ambulatory Visit | Attending: Family Medicine | Admitting: Family Medicine

## 2020-07-02 DIAGNOSIS — N6452 Nipple discharge: Secondary | ICD-10-CM

## 2020-07-02 DIAGNOSIS — Z0000 Encounter for general adult medical examination without abnormal findings: Secondary | ICD-10-CM

## 2020-07-16 ENCOUNTER — Other Ambulatory Visit: Payer: 59

## 2020-07-30 ENCOUNTER — Telehealth: Payer: Self-pay | Admitting: Gastroenterology

## 2020-07-30 NOTE — Telephone Encounter (Signed)
Okay thanks for letting me know

## 2020-07-30 NOTE — Telephone Encounter (Signed)
Called to remind patient of her procedure scheduled for 08-03-20, she stated her mother had passed away and she was on her way out town and was going to call us. She cancelled appointment and will reschedule once she comes back in town.

## 2020-08-03 ENCOUNTER — Encounter: Payer: 59 | Admitting: Gastroenterology

## 2020-08-25 ENCOUNTER — Ambulatory Visit
Admission: RE | Admit: 2020-08-25 | Discharge: 2020-08-25 | Disposition: A | Discharge status: Home / Self Care | Payer: 59 | Source: Ambulatory Visit | Attending: Family Medicine | Admitting: Family Medicine

## 2020-08-25 ENCOUNTER — Other Ambulatory Visit: Payer: Self-pay

## 2020-08-25 ENCOUNTER — Other Ambulatory Visit: Payer: Self-pay | Admitting: Family Medicine

## 2020-08-25 DIAGNOSIS — N6452 Nipple discharge: Secondary | ICD-10-CM

## 2020-09-01 ENCOUNTER — Other Ambulatory Visit: Payer: 59

## 2020-09-09 ENCOUNTER — Other Ambulatory Visit: Payer: Self-pay

## 2020-09-09 ENCOUNTER — Encounter: Payer: Self-pay | Admitting: Family Medicine

## 2020-09-09 ENCOUNTER — Ambulatory Visit
Admission: RE | Admit: 2020-09-09 | Discharge: 2020-09-09 | Disposition: A | Discharge status: Home / Self Care | Payer: 59 | Source: Ambulatory Visit | Attending: Family Medicine | Admitting: Family Medicine

## 2020-09-09 DIAGNOSIS — N6452 Nipple discharge: Secondary | ICD-10-CM

## 2021-11-07 IMAGING — MG MM BREAST LOCALIZATION CLIP
5 series · 6 of 13 positions shown · non-contrast
Comparison: Previous exam(s).

CLINICAL DATA: Evaluate RIBBON biopsy clip placement following
ultrasound-guided LEFT breast biopsy.

EXAM:
3D DIAGNOSTIC LEFT MAMMOGRAM POST ULTRASOUND BIOPSY

[L ML synth-2D]
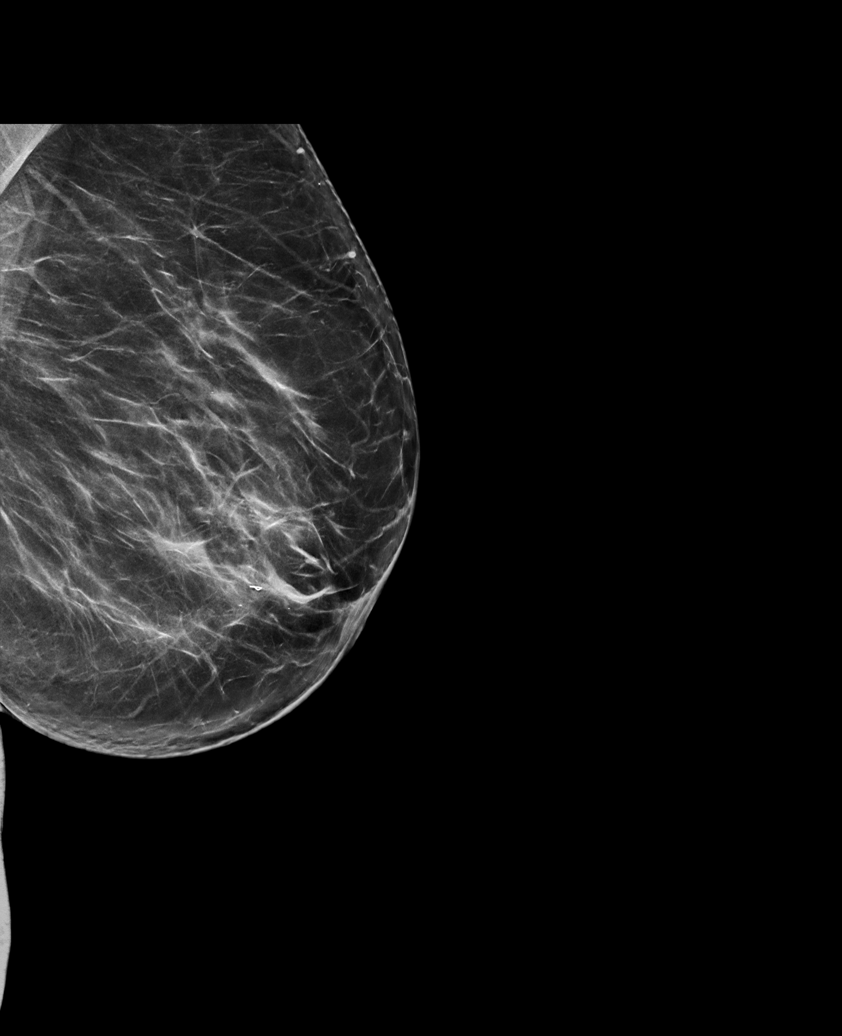

[L CC synth-2D]
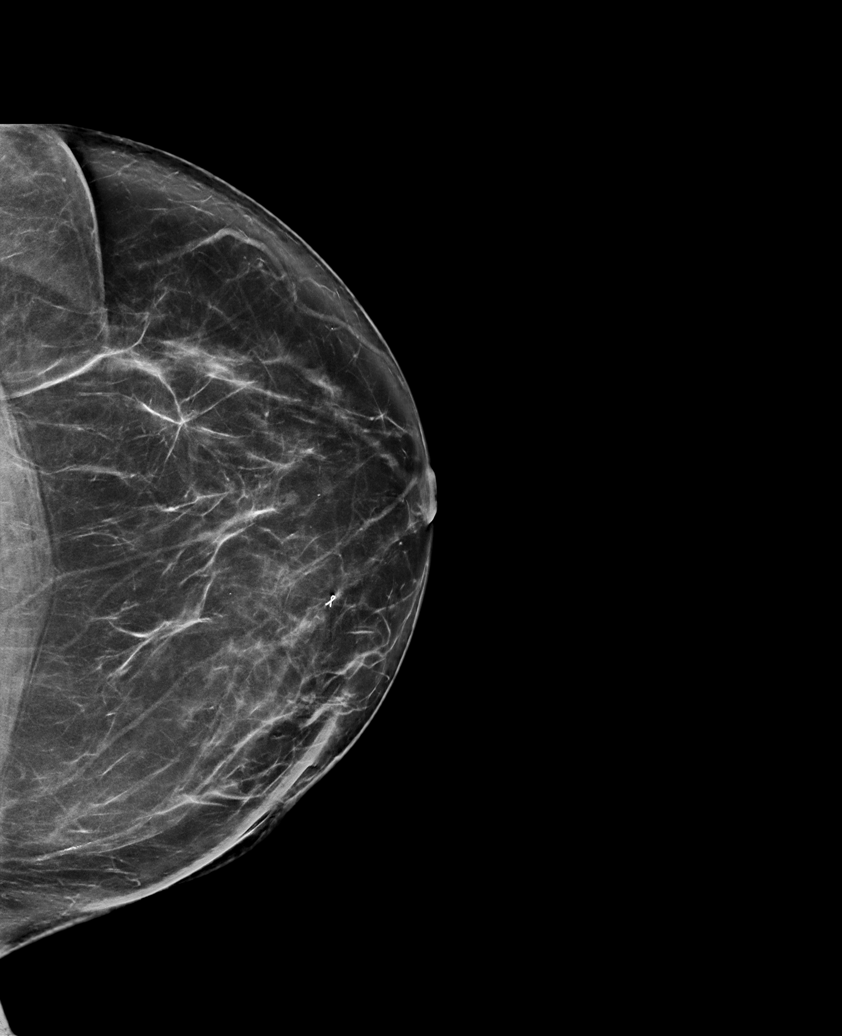

[L CC]
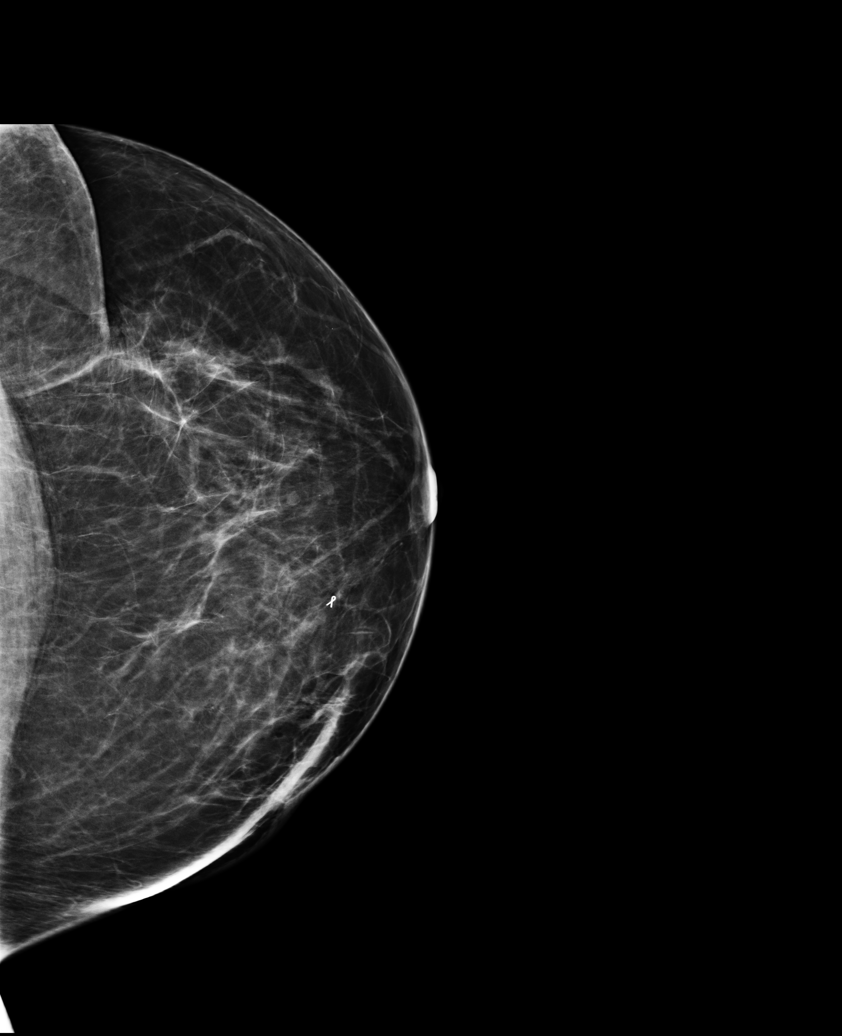

[L CC tomo · 2 of 85 frames shown]
[frame 28/85]
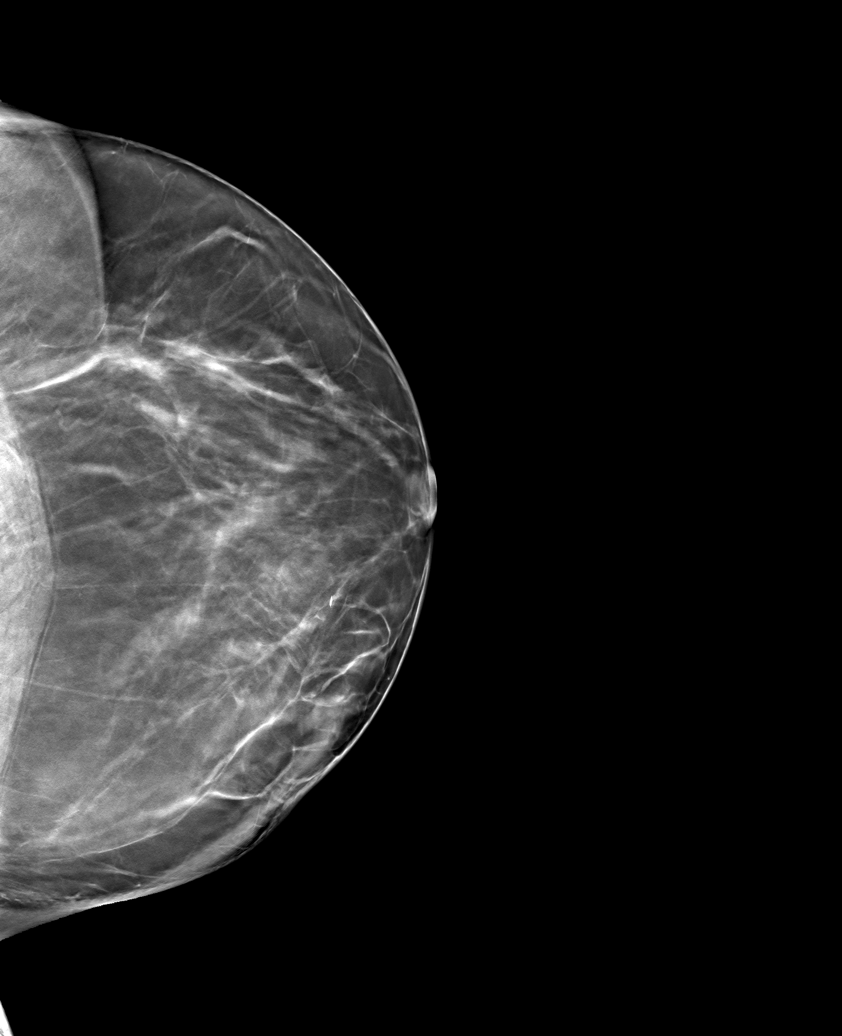
[frame 43/85]
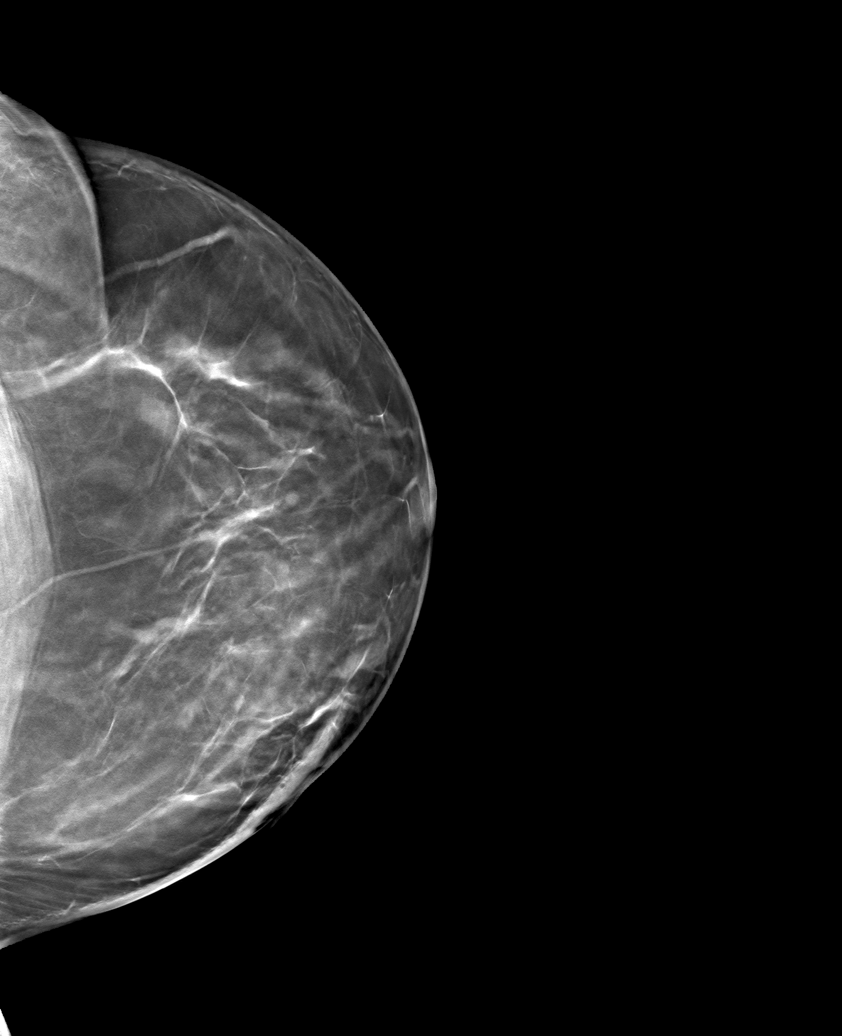

[L ML tomo · tomo slice 43/86.0]
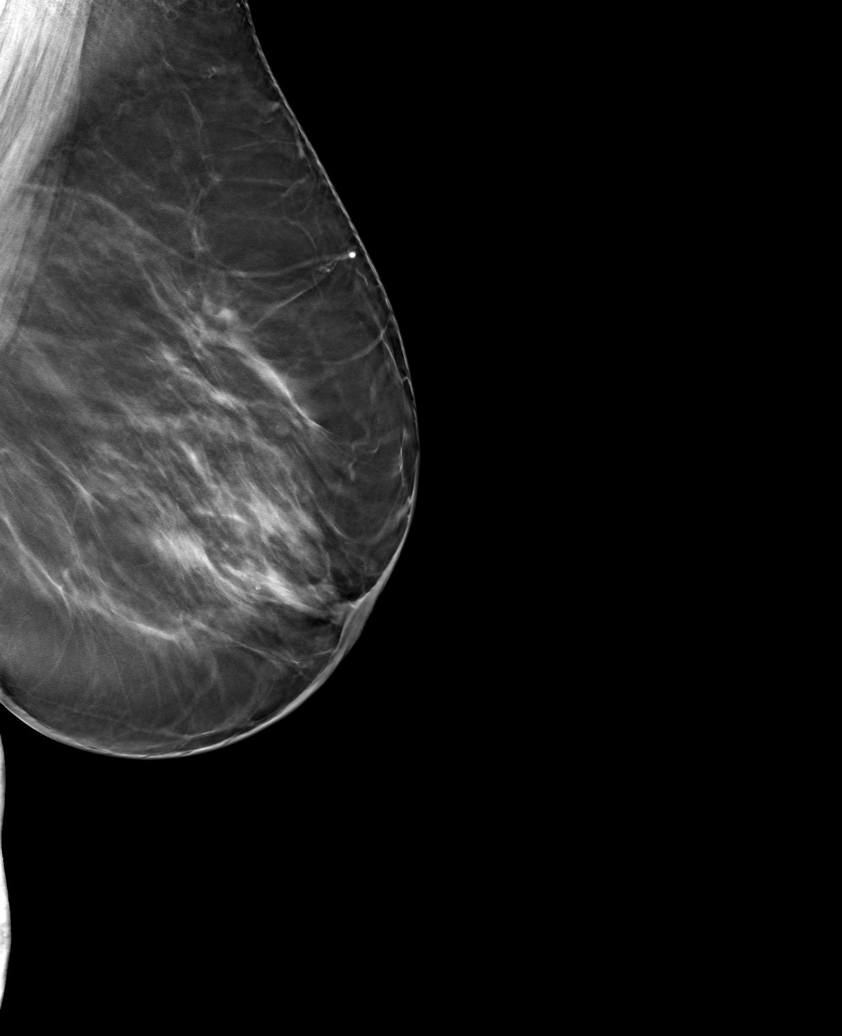

[6 of 13 positions shown; findings below may reference images not displayed]

FINDINGS: 3D Mammographic images were obtained following ultrasound guided
biopsy of and the 1.6 cm intraductal hypoechoic area at the 10
o'clock position of the LEFT breast. The RIBBON biopsy marking clip
is in expected position at the site of biopsy.
IMPRESSION: Appropriate positioning of the RIBBON shaped biopsy marking clip at
the site of biopsy in the INNER LEFT breast.

Final Assessment: Post Procedure Mammograms for Marker Placement

## 2021-11-07 IMAGING — US US BREAST BX W LOC DEV 1ST LESION IMG BX SPEC US GUIDE*L*
1 series · 9 of 9 positions shown · non-contrast
Comparison: Previous exam(s).
COMPARISON: Previous exam(s).

Addendum:
CLINICAL DATA: 54-year-old female for tissue sampling of
intraductal hypoechoic material/mass in the UPPER INNER LEFT breast.

EXAM:
ULTRASOUND GUIDED LEFT BREAST CORE NEEDLE BIOPSY

[Series 1: us breast bx w loc dev 1st lesion img bx spec us g · 0.05mm/px · 9 of 9 slices shown]
[im 1/9]
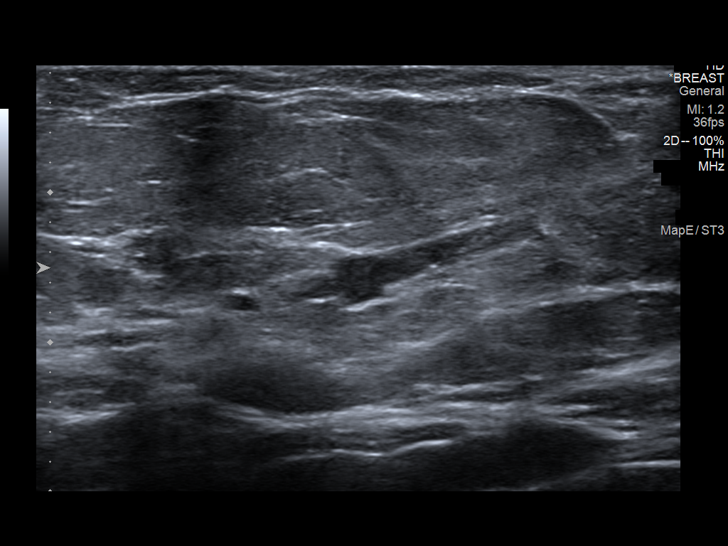
[im 2/9]
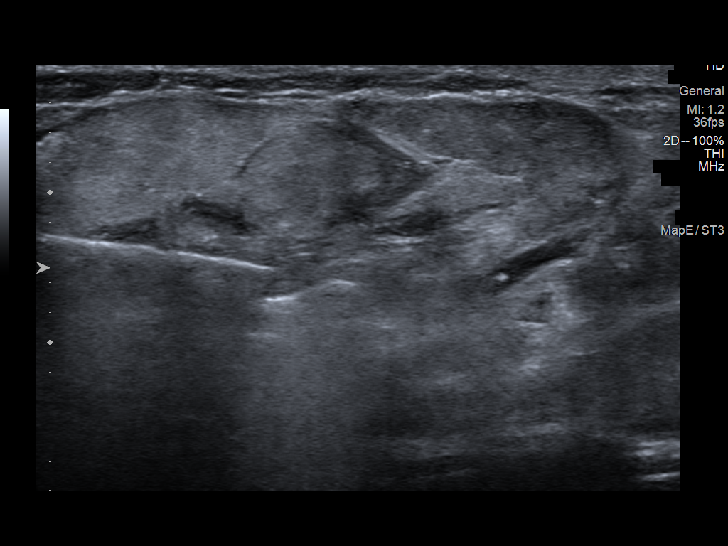
[im 3/9]
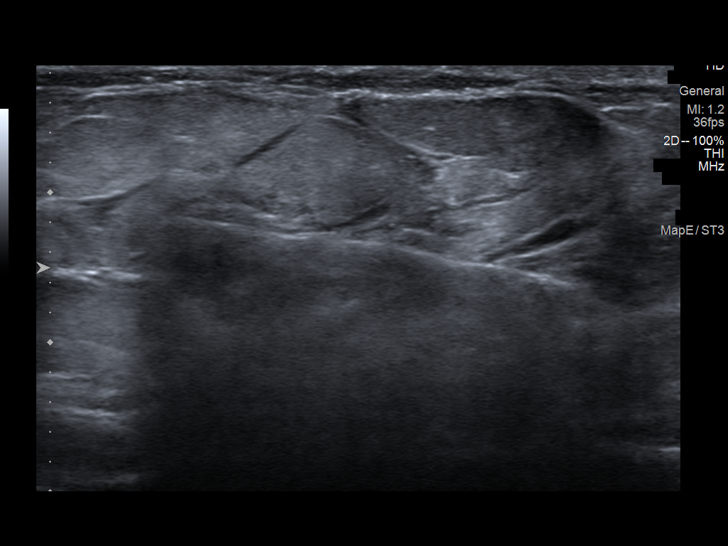
[im 4/9]
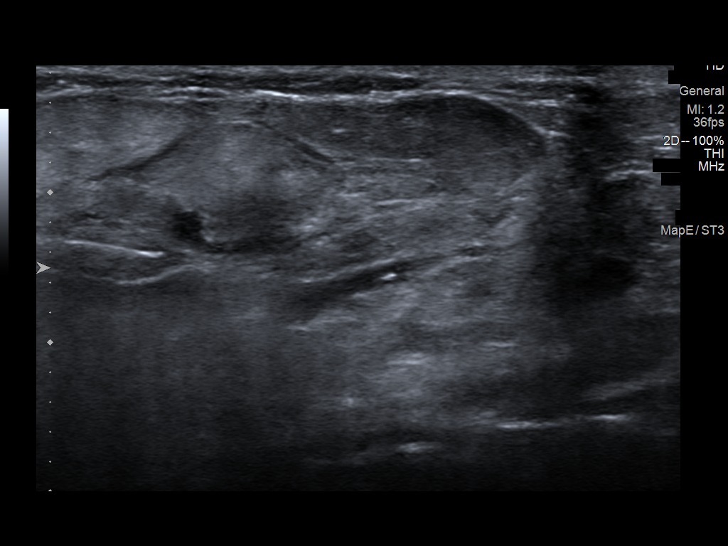
[im 5/9]
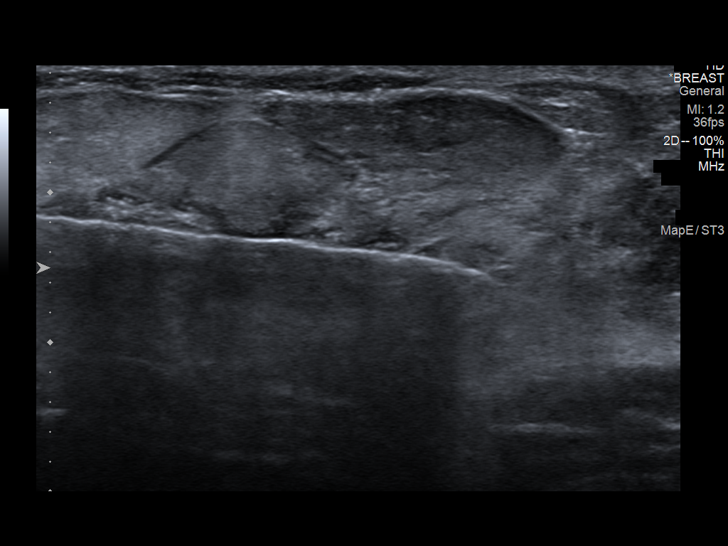
[im 6/9]
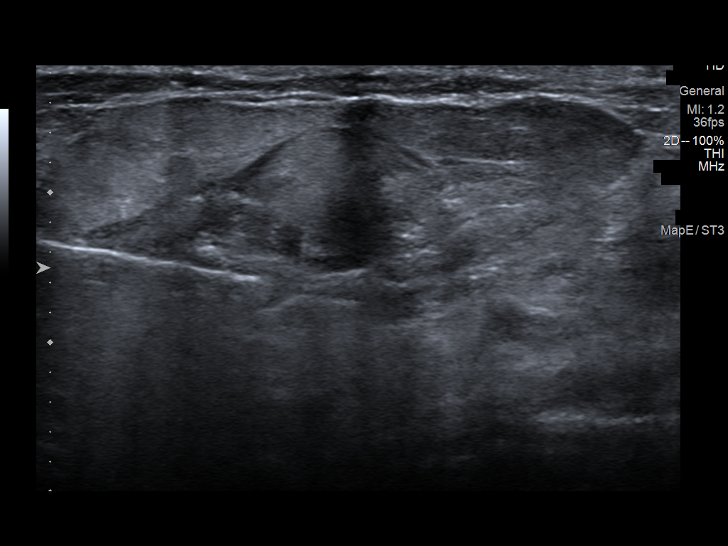
[im 7/9]
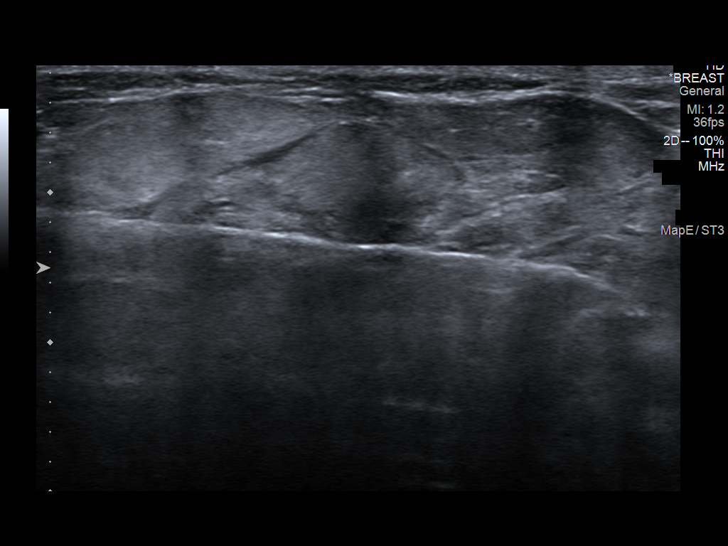
[im 8/9]
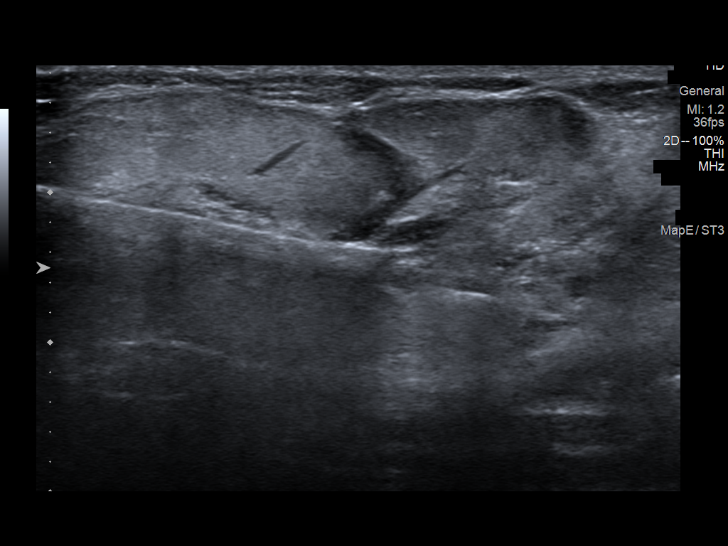
[im 9/9]
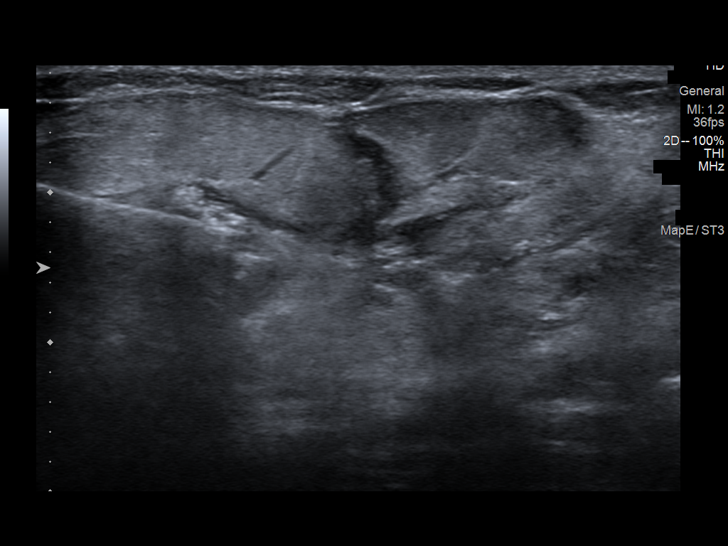

[9 of 9 positions shown; findings below may reference images not displayed]



Lesion quadrant: UPPER INNER LEFT breast

Using sterile technique and 1% Lidocaine with and without
epinephrine as local anesthetic, under direct ultrasound
visualization, a 12 gauge Lara device was used to perform
biopsy of the 1.6 cm intraductal hypoechoic area at the 10 o'clock
position of the LEFT breast 3 cm from the nipple using a MEDIAL
approach. At the conclusion of the procedure a RIBBON tissue marker
clip was deployed into the biopsy cavity. Follow up 2 view mammogram
was performed and dictated separately.
IMPRESSION: Ultrasound guided biopsy of intraductal hypoechoic area in the UPPER
INNER LEFT breast. No apparent complications.

ADDENDUM:
Pathology revealed DUCTAL PAPILLOMA of the LEFT breast, upper inner
quadrant. This was found to be concordant by Dr. Msekhula Komaetsile, with
surgical consultation for consideration of excision recommended.

Pathology results were discussed with the patient by telephone. The
patient reported doing well after the biopsy with tenderness at the
site. Post biopsy instructions and care were reviewed and questions
were answered. The patient was encouraged to call The [REDACTED]

Surgical referral made to [REDACTED] on 09/10/20.
Patient instructed to contact [facility] ([employer]) to schedule via
phone message on 09/11/20 and 09/14/20.

Pathology results reported by Yadiiel Carlton RN on 09/14/2020.



Lesion quadrant: UPPER INNER LEFT breast

Using sterile technique and 1% Lidocaine with and without
epinephrine as local anesthetic, under direct ultrasound
visualization, a 12 gauge Lara device was used to perform
biopsy of the 1.6 cm intraductal hypoechoic area at the 10 o'clock
position of the LEFT breast 3 cm from the nipple using a MEDIAL
approach. At the conclusion of the procedure a RIBBON tissue marker
clip was deployed into the biopsy cavity. Follow up 2 view mammogram
was performed and dictated separately.
IMPRESSION: Ultrasound guided biopsy of intraductal hypoechoic area in the UPPER
INNER LEFT breast. No apparent complications.
